# Patient Record
Sex: Female | Born: 1950 | Race: White | Hispanic: No | Marital: Married | Smoking: Never smoker
Health system: Southern US, Community
[De-identification: ages and names within clinical notes are randomized; demographics above are authoritative.]

## PROBLEM LIST (undated history)

## (undated) DIAGNOSIS — T7840XA Allergy, unspecified, initial encounter: Secondary | ICD-10-CM

## (undated) HISTORY — PX: BREAST BIOPSY: SHX20

## (undated) HISTORY — PX: COSMETIC SURGERY: SHX468

## (undated) HISTORY — DX: Allergy, unspecified, initial encounter: T78.40XA

---

## 1999-03-19 ENCOUNTER — Encounter: Payer: Self-pay | Admitting: Internal Medicine

## 1999-03-19 ENCOUNTER — Encounter: Admission: RE | Admit: 1999-03-19 | Discharge: 1999-03-19 | Payer: Self-pay | Admitting: Internal Medicine

## 1999-03-26 ENCOUNTER — Other Ambulatory Visit: Admission: RE | Admit: 1999-03-26 | Discharge: 1999-03-26 | Payer: Self-pay | Admitting: *Deleted

## 1999-03-26 ENCOUNTER — Encounter (INDEPENDENT_AMBULATORY_CARE_PROVIDER_SITE_OTHER): Payer: Self-pay | Admitting: *Deleted

## 1999-05-07 ENCOUNTER — Encounter (INDEPENDENT_AMBULATORY_CARE_PROVIDER_SITE_OTHER): Payer: Self-pay

## 1999-05-07 ENCOUNTER — Ambulatory Visit (HOSPITAL_COMMUNITY): Admission: RE | Admit: 1999-05-07 | Discharge: 1999-05-07 | Payer: Self-pay | Admitting: Obstetrics & Gynecology

## 2000-07-03 ENCOUNTER — Encounter: Admission: RE | Admit: 2000-07-03 | Discharge: 2000-07-03 | Payer: Self-pay | Admitting: Internal Medicine

## 2000-07-03 ENCOUNTER — Encounter: Payer: Self-pay | Admitting: Internal Medicine

## 2000-07-31 ENCOUNTER — Encounter: Admission: RE | Admit: 2000-07-31 | Discharge: 2000-07-31 | Payer: Self-pay | Admitting: Internal Medicine

## 2000-07-31 ENCOUNTER — Encounter: Payer: Self-pay | Admitting: Internal Medicine

## 2001-10-04 ENCOUNTER — Ambulatory Visit (HOSPITAL_COMMUNITY): Admission: RE | Admit: 2001-10-04 | Discharge: 2001-10-04 | Payer: Self-pay | Admitting: Gastroenterology

## 2002-12-20 ENCOUNTER — Encounter: Admission: RE | Admit: 2002-12-20 | Discharge: 2002-12-20 | Payer: Self-pay | Admitting: Internal Medicine

## 2002-12-20 ENCOUNTER — Encounter: Payer: Self-pay | Admitting: Internal Medicine

## 2002-12-20 ENCOUNTER — Other Ambulatory Visit: Admission: RE | Admit: 2002-12-20 | Discharge: 2002-12-20 | Payer: Self-pay | Admitting: Obstetrics and Gynecology

## 2003-12-23 ENCOUNTER — Encounter: Admission: RE | Admit: 2003-12-23 | Discharge: 2003-12-23 | Payer: Self-pay | Admitting: Internal Medicine

## 2004-07-22 ENCOUNTER — Encounter: Admission: RE | Admit: 2004-07-22 | Discharge: 2004-07-22 | Payer: Self-pay | Admitting: Internal Medicine

## 2005-03-04 ENCOUNTER — Encounter: Admission: RE | Admit: 2005-03-04 | Discharge: 2005-03-04 | Payer: Self-pay | Admitting: Internal Medicine

## 2005-11-21 ENCOUNTER — Other Ambulatory Visit: Admission: RE | Admit: 2005-11-21 | Discharge: 2005-11-21 | Payer: Self-pay | Admitting: Obstetrics and Gynecology

## 2006-03-09 ENCOUNTER — Encounter: Admission: RE | Admit: 2006-03-09 | Discharge: 2006-03-09 | Payer: Self-pay | Admitting: Internal Medicine

## 2007-03-26 ENCOUNTER — Encounter: Admission: RE | Admit: 2007-03-26 | Discharge: 2007-03-26 | Payer: Self-pay | Admitting: Internal Medicine

## 2007-09-14 ENCOUNTER — Encounter: Admission: RE | Admit: 2007-09-14 | Discharge: 2007-09-14 | Payer: Self-pay | Admitting: Internal Medicine

## 2008-05-09 ENCOUNTER — Encounter: Admission: RE | Admit: 2008-05-09 | Discharge: 2008-05-09 | Payer: Self-pay | Admitting: Internal Medicine

## 2008-06-20 ENCOUNTER — Other Ambulatory Visit: Admission: RE | Admit: 2008-06-20 | Discharge: 2008-06-20 | Payer: Self-pay | Admitting: Obstetrics and Gynecology

## 2009-11-02 ENCOUNTER — Other Ambulatory Visit: Admission: RE | Admit: 2009-11-02 | Discharge: 2009-11-02 | Payer: Self-pay | Admitting: Obstetrics and Gynecology

## 2009-11-23 ENCOUNTER — Encounter: Admission: RE | Admit: 2009-11-23 | Discharge: 2009-11-23 | Payer: Self-pay | Admitting: Internal Medicine

## 2010-08-13 NOTE — Op Note (Signed)
[facility]  Patient:    Teresa Decker, Teresa Decker               MRN: [id number] Proc. Date: 05/07/99 Adm. Date:  [id number] Attending:  Genia Del                           Operative Report  DATE OF BIRTH:                April 23, 1950.  PREOPERATIVE DIAGNOSIS:       Menometrorrhagia with possible endocervical polyp.  POSTOPERATIVE DIAGNOSIS:      Menometrorrhagia with possible endocervical polyp.  INTERVENTION:                 Hysteroscopy with dilatation and curettage and polypectomy.  SURGEON:                      Genia Del, M.D.  ANESTHESIA:                   Gretta Cool., M.D.  DESCRIPTION OF PROCEDURE:     Under MAC, the patient is in the lithotomy position. The patient is prepped with Betadine on the suprapubic, vulvular and vaginal areas. A bladder catheterization is done and the patient is draped as usual.  The vaginal exam revealed a retroverted uterus, slight increase in volume with no adnexal mass. The speculum is inserted. The paracervical block is done with methane 1% 20 cc re used at 4 oclock, 8 oclock and at the uterosacral ligament location and on the anterior lip of the cervix.  We applied the ___  clamp on the anterior lip of the cervix.  The patient is dilated with Hegar dilators up to #33.  The hysterometry is 9 cm.  The patient is done without any difficulty.  Then the hysteroscope is introduced.  Pictures are taken of the ostia.  The cavity is normal in appearance. There might be a very small anterior polyp or this might correspond to an endometrial ___ .  Anyway, this is removed with the loop instrument by cauterization.  Then the hysteroscope is removed, the cavity is curetted with a  sharp curette on all the surfaces systematically.  The endometrial curettings are sent to pathology.  Then the hysteroscope is introduced once more.  The cavity s normal in appearance.  Hemostasis is  adequate.  The instruments are removed. The estimated blood loss was minimal.  The ins and outs deficits were -60 cc.  No complication occurred and the patient was transferred to recovery room in good status. DD:  05/07/99 TD:  05/09/99 Job: [id number]

## 2010-08-13 NOTE — Op Note (Signed)
[place]  Patient:    CHANTALE, LEUGERS Visit Number: [id number] MRN: [id number]          Service Type: Attending:  Verlin Grills, M.D. Dictated by:   Verlin Grills, M.D. Proc. Date: 10/04/01 Adm. Date:  10/04/01   CC:         Winn Jock. Earl Gala, M.D.   Operative Report  INDICATIONS:  Ms. Latayvia Mandujano is a 60 year old female born on 1950/04/28.  Ms. Crammer father was diagnosed with colon cancer in his mid-55s.  The patient is to undergo her first screening colonoscopy with polypectomy to prevent colon cancer.  I discussed with the patient the complications associated with colonoscopy and polypectomy including a 15 per 1000 risk of bleeding and 4 per 1000 risk of colon perforation requiring surgical repair.  The patient has signed the operative permit.  ENDOSCOPIST:  Verlin Grills, M.D.  PREMEDICATION:  Versed 7.5 mg, fentanyl 50 mcg.  ENDOSCOPE:  Olympus pediatric colonoscope.  DESCRIPTION OF PROCEDURE:  After obtaining informed consent, the patient was placed in the left lateral decubitus position.  I administered intravenous fentanyl and intravenous Versed to achieve conscious sedation for the procedure.  The patients blood pressure, oxygen saturation, and cardiac rhythm were monitored throughout the procedure and documented in the medical record.  Anal inspection was normal.  Digital rectal examination was normal.  The Olympus pediatric video colonoscope was introduced into the rectum and easily advanced to the cecum.  Colonic preparation for the examination today was excellent.  Rectum normal.  Sigmoid colon and descending colon normal.  Splenic flexure normal.  Transverse colon normal.  Hepatic flexure normal.  Ascending colon normal.  Cecum and ileocecal valve normal.  ASSESSMENT:  Normal screening proctocolonoscopy to the cecum.  RECOMMENDATIONS:  Repeat  colonoscopy in 10 years. Dictated by:   Verlin Grills, M.D. Attending:  Verlin Grills, M.D. DD:  10/04/01 TD:  10/07/01 Job: [id number]

## 2011-02-03 ENCOUNTER — Other Ambulatory Visit: Payer: Self-pay | Admitting: Internal Medicine

## 2011-02-03 DIAGNOSIS — Z1231 Encounter for screening mammogram for malignant neoplasm of breast: Secondary | ICD-10-CM

## 2011-03-14 ENCOUNTER — Ambulatory Visit: Payer: Self-pay

## 2011-03-18 ENCOUNTER — Ambulatory Visit
Admission: RE | Admit: 2011-03-18 | Discharge: 2011-03-18 | Disposition: A | Discharge status: Home / Self Care | Payer: BC Managed Care – PPO | Source: Ambulatory Visit | Attending: Internal Medicine | Admitting: Internal Medicine

## 2011-03-18 DIAGNOSIS — Z1231 Encounter for screening mammogram for malignant neoplasm of breast: Secondary | ICD-10-CM

## 2011-11-09 ENCOUNTER — Other Ambulatory Visit: Payer: Self-pay | Admitting: Obstetrics and Gynecology

## 2011-11-09 ENCOUNTER — Other Ambulatory Visit (HOSPITAL_COMMUNITY)
Admission: RE | Admit: 2011-11-09 | Discharge: 2011-11-09 | Disposition: A | Discharge status: Home / Self Care | Payer: BC Managed Care – PPO | Source: Ambulatory Visit | Attending: Obstetrics and Gynecology | Admitting: Obstetrics and Gynecology

## 2011-11-09 DIAGNOSIS — Z1151 Encounter for screening for human papillomavirus (HPV): Secondary | ICD-10-CM | POA: Insufficient documentation

## 2011-11-09 DIAGNOSIS — Z01419 Encounter for gynecological examination (general) (routine) without abnormal findings: Secondary | ICD-10-CM | POA: Insufficient documentation

## 2012-03-14 DIAGNOSIS — N3010 Interstitial cystitis (chronic) without hematuria: Secondary | ICD-10-CM | POA: Insufficient documentation

## 2012-03-14 DIAGNOSIS — R339 Retention of urine, unspecified: Secondary | ICD-10-CM | POA: Insufficient documentation

## 2012-03-14 DIAGNOSIS — R399 Unspecified symptoms and signs involving the genitourinary system: Secondary | ICD-10-CM | POA: Insufficient documentation

## 2012-03-14 DIAGNOSIS — N390 Urinary tract infection, site not specified: Secondary | ICD-10-CM | POA: Insufficient documentation

## 2012-03-30 ENCOUNTER — Other Ambulatory Visit: Payer: Self-pay | Admitting: Internal Medicine

## 2012-03-30 DIAGNOSIS — Z1231 Encounter for screening mammogram for malignant neoplasm of breast: Secondary | ICD-10-CM

## 2012-04-30 ENCOUNTER — Ambulatory Visit
Admission: RE | Admit: 2012-04-30 | Discharge: 2012-04-30 | Disposition: A | Discharge status: Home / Self Care | Payer: BC Managed Care – PPO | Source: Ambulatory Visit | Attending: Internal Medicine | Admitting: Internal Medicine

## 2012-04-30 DIAGNOSIS — Z1231 Encounter for screening mammogram for malignant neoplasm of breast: Secondary | ICD-10-CM

## 2012-12-16 ENCOUNTER — Ambulatory Visit (INDEPENDENT_AMBULATORY_CARE_PROVIDER_SITE_OTHER): Payer: BC Managed Care – PPO | Admitting: Physician Assistant

## 2012-12-16 VITALS — BP 148/88 | HR 118 | Temp 100.8°F | Resp 18 | Ht 62.0 in | Wt 136.4 lb

## 2012-12-16 DIAGNOSIS — R059 Cough, unspecified: Secondary | ICD-10-CM

## 2012-12-16 DIAGNOSIS — R05 Cough: Secondary | ICD-10-CM

## 2012-12-16 DIAGNOSIS — J9801 Acute bronchospasm: Secondary | ICD-10-CM

## 2012-12-16 DIAGNOSIS — J019 Acute sinusitis, unspecified: Secondary | ICD-10-CM

## 2012-12-16 MED ORDER — ALBUTEROL SULFATE HFA 108 (90 BASE) MCG/ACT IN AERS: 2.0000 | INHALATION_SPRAY | RESPIRATORY_TRACT | Status: AC | PRN

## 2012-12-16 MED ORDER — AMOXICILLIN-POT CLAVULANATE 875-125 MG PO TABS: 1.0000 | ORAL_TABLET | Freq: Two times a day (BID) | ORAL | Status: DC

## 2012-12-16 NOTE — Progress Notes (Signed)
Patient ID: Teresa Decker MRN: [id number], DOB: 1950/11/09, 62 y.o. Date of Encounter: 12/16/2012, 2:17 PM  Primary Physician: Darnelle Bos, MD  Chief Complaint:  Chief Complaint  Patient presents with  . Headache    started Tuesday  . Nasal Congestion  . Cough    HPI: 62 y.o. female presents with 4-5 day history of nasal congestion, post nasal drip, sore throat, sinus pressure, headache, and cough. Subjective fever and chills. Nasal congestion thick and green/yellow. Sinus pressure is the worst symptom. Cough is productive mildly secondary to post nasal drip and not associated with time of day. Headache is located along the sinuses. Feels like sinus pressure. Normal hearing. Has tried OTC cold preps with some success. No GI complaints. Appetite normal. Husband has been sick with similar illness as well. He is feeling better now.   No recent antibiotics or recent travels.  No leg trauma, sedentary periods, h/o cancer, or tobacco use.  Past Medical History  Diagnosis Date  . Allergy      Home Meds: Prior to Admission medications   Medication Sig Start Date End Date Taking? Authorizing Provider  cetirizine (ZYRTEC) 1 MG/ML syrup Take by mouth daily.   Yes Historical Provider, MD  FLUoxetine (PROZAC) 10 MG tablet Take 10 mg by mouth daily.   Yes Historical Provider, MD  fluticasone (FLONASE) 50 MCG/ACT nasal spray Place 2 sprays into the nose daily.   Yes Historical Provider, MD  imipramine (TOFRANIL) 10 MG tablet Take 30 mg by mouth at bedtime.   Yes Historical Provider, MD  Multiple Vitamin (MULTIVITAMIN) capsule Take 1 capsule by mouth daily.   Yes Historical Provider, MD                  Allergies: No Known Allergies  History   Social History  . Marital Status: Married    Spouse Name: N/A    Number of Children: N/A  . Years of Education: N/A   Occupational History  . Not on file.   Social History Main Topics  . Smoking status: Never Smoker     . Smokeless tobacco: Not on file  . Alcohol Use: No  . Drug Use: No  . Sexual Activity: Not on file   Other Topics Concern  . Not on file   Social History Narrative  . No narrative on file     Review of Systems: Constitutional: Positive for fever, chills, and fatigue  HEENT: see above Cardiovascular: negative for chest pain or palpitations Respiratory: Positive for cough, wheezing, and SOB Abdominal: negative for abdominal pain, nausea, vomiting or diarrhea Dermatological: negative for rash Neurologic: Positive for headache. Negative for dizziness or vertigo   Physical Exam: Blood pressure 148/88, pulse 118, temperature 100.8 F (38.2 C), temperature source Oral, resp. rate 18, height 5\' 2"  (1.575 m), weight 136 lb 6.4 oz (61.871 kg), SpO2 98.00%., Body mass index is 24.94 kg/(m^2). General: Well developed, well nourished, in no acute distress. Head: Normocephalic, atraumatic, eyes without discharge, sclera non-icteric, nares are congested. Bilateral auditory canals clear, TM's are without perforation, pearly grey with reflective cone of light bilaterally. Serous effusion bilaterally behind TM's. Palpation of the sinuses relieves pressure. Oral cavity moist, dentition normal. Posterior pharynx with post nasal drip and mild erythema. No peritonsillar abscess or tonsillar exudate. Uvula midline.  Neck: Supple. No thyromegaly. Full ROM. No lymphadenopathy. Lungs: Clear bilaterally to auscultation without wheezes, rales, or rhonchi. Breathing is unlabored.  Heart: RRR with S1 S2. No murmurs, rubs,  or gallops appreciated. Msk:  Strength and tone normal for age. Extremities: No clubbing or cyanosis. No edema. Neuro: Alert and oriented X 3. Moves all extremities spontaneously. CNII-XII grossly in tact. Psych:  Responds to questions appropriately with a normal affect.     ASSESSMENT AND PLAN:  62 y.o. female with sinusitis, cough, and RAD -Augmentin 875/125 mg 1 po bid #20 no  RF -Proventil 2 puffs inhaled q 4-6 hours prn #1 no RF -Will take OTC Robitussin -Mucinex -Tylenol/Motrin prn -Rest/fluids -RTC precautions -RTC 3-5 days if no improvement  Signed, Eula Listen, PA-C 12/16/2012 2:17 PM

## 2013-06-11 ENCOUNTER — Other Ambulatory Visit: Payer: Self-pay

## 2013-06-11 DIAGNOSIS — Z1231 Encounter for screening mammogram for malignant neoplasm of breast: Secondary | ICD-10-CM

## 2013-07-12 ENCOUNTER — Ambulatory Visit
Admission: RE | Admit: 2013-07-12 | Discharge: 2013-07-12 | Disposition: A | Discharge status: Home / Self Care | Payer: BC Managed Care – PPO | Source: Ambulatory Visit

## 2013-07-12 DIAGNOSIS — Z1231 Encounter for screening mammogram for malignant neoplasm of breast: Secondary | ICD-10-CM

## 2013-07-17 ENCOUNTER — Other Ambulatory Visit: Payer: Self-pay | Admitting: Internal Medicine

## 2013-07-17 DIAGNOSIS — R928 Other abnormal and inconclusive findings on diagnostic imaging of breast: Secondary | ICD-10-CM

## 2013-07-19 ENCOUNTER — Ambulatory Visit
Admission: RE | Admit: 2013-07-19 | Discharge: 2013-07-19 | Disposition: A | Discharge status: Home / Self Care | Payer: BC Managed Care – PPO | Source: Ambulatory Visit | Attending: Internal Medicine | Admitting: Internal Medicine

## 2013-07-19 DIAGNOSIS — R928 Other abnormal and inconclusive findings on diagnostic imaging of breast: Secondary | ICD-10-CM

## 2014-09-26 ENCOUNTER — Telehealth: Payer: Self-pay | Admitting: Internal Medicine

## 2014-09-26 NOTE — Telephone Encounter (Signed)
Called pt and she was concerned that Dr Elvera LennoxGherghe didn't have a copy of her most recent Bone density test. Pt to get her physicians office to send a copy of the report to Dr Elvera LennoxGherghe prior to her appt in Aug.

## 2014-09-26 NOTE — Telephone Encounter (Signed)
Please call pt back she wants tomake sure we have enough information for her first apt call pt at [phone] or [phone]

## 2014-10-01 ENCOUNTER — Ambulatory Visit: Payer: BC Managed Care – PPO | Admitting: Internal Medicine

## 2014-10-27 ENCOUNTER — Other Ambulatory Visit: Payer: Self-pay

## 2014-10-27 DIAGNOSIS — Z1231 Encounter for screening mammogram for malignant neoplasm of breast: Secondary | ICD-10-CM

## 2014-11-12 ENCOUNTER — Ambulatory Visit: Payer: BC Managed Care – PPO | Admitting: Internal Medicine

## 2014-11-18 ENCOUNTER — Other Ambulatory Visit: Payer: Self-pay | Admitting: Obstetrics and Gynecology

## 2014-11-18 ENCOUNTER — Other Ambulatory Visit (HOSPITAL_COMMUNITY)
Admission: RE | Admit: 2014-11-18 | Discharge: 2014-11-18 | Disposition: A | Discharge status: Home / Self Care | Payer: BC Managed Care – PPO | Source: Ambulatory Visit | Attending: Obstetrics and Gynecology | Admitting: Obstetrics and Gynecology

## 2014-11-18 DIAGNOSIS — Z1151 Encounter for screening for human papillomavirus (HPV): Secondary | ICD-10-CM | POA: Insufficient documentation

## 2014-11-18 DIAGNOSIS — Z01419 Encounter for gynecological examination (general) (routine) without abnormal findings: Secondary | ICD-10-CM | POA: Diagnosis not present

## 2014-11-19 LAB — CYTOLOGY - PAP

## 2014-12-03 ENCOUNTER — Ambulatory Visit
Admission: RE | Admit: 2014-12-03 | Discharge: 2014-12-03 | Disposition: A | Discharge status: Home / Self Care | Payer: BC Managed Care – PPO | Source: Ambulatory Visit

## 2014-12-03 DIAGNOSIS — Z1231 Encounter for screening mammogram for malignant neoplasm of breast: Secondary | ICD-10-CM

## 2015-12-24 ENCOUNTER — Other Ambulatory Visit: Payer: Self-pay | Admitting: Geriatric Medicine

## 2015-12-24 DIAGNOSIS — Z1231 Encounter for screening mammogram for malignant neoplasm of breast: Secondary | ICD-10-CM

## 2016-01-11 ENCOUNTER — Ambulatory Visit
Admission: RE | Admit: 2016-01-11 | Discharge: 2016-01-11 | Disposition: A | Discharge status: Home / Self Care | Payer: BC Managed Care – PPO | Source: Ambulatory Visit | Attending: Geriatric Medicine | Admitting: Geriatric Medicine

## 2016-01-11 DIAGNOSIS — Z1231 Encounter for screening mammogram for malignant neoplasm of breast: Secondary | ICD-10-CM

## 2017-03-30 ENCOUNTER — Other Ambulatory Visit: Payer: Self-pay | Admitting: Geriatric Medicine

## 2017-03-30 DIAGNOSIS — Z1231 Encounter for screening mammogram for malignant neoplasm of breast: Secondary | ICD-10-CM

## 2017-04-20 ENCOUNTER — Ambulatory Visit
Admission: RE | Admit: 2017-04-20 | Discharge: 2017-04-20 | Disposition: A | Discharge status: Home / Self Care | Payer: BC Managed Care – PPO | Source: Ambulatory Visit | Attending: Geriatric Medicine | Admitting: Geriatric Medicine

## 2017-04-20 DIAGNOSIS — Z1231 Encounter for screening mammogram for malignant neoplasm of breast: Secondary | ICD-10-CM

## 2017-11-24 ENCOUNTER — Other Ambulatory Visit: Payer: Self-pay | Admitting: Obstetrics and Gynecology

## 2017-11-24 ENCOUNTER — Other Ambulatory Visit (HOSPITAL_COMMUNITY)
Admission: RE | Admit: 2017-11-24 | Discharge: 2017-11-24 | Disposition: A | Discharge status: Home / Self Care | Payer: BC Managed Care – PPO | Source: Ambulatory Visit | Attending: Obstetrics and Gynecology | Admitting: Obstetrics and Gynecology

## 2017-11-24 DIAGNOSIS — Z01411 Encounter for gynecological examination (general) (routine) with abnormal findings: Secondary | ICD-10-CM | POA: Insufficient documentation

## 2017-11-29 LAB — CYTOLOGY - PAP
DIAGNOSIS: NEGATIVE
HPV: NOT DETECTED

## 2018-06-29 ENCOUNTER — Other Ambulatory Visit: Payer: Self-pay | Admitting: Obstetrics and Gynecology

## 2018-06-29 DIAGNOSIS — Z1231 Encounter for screening mammogram for malignant neoplasm of breast: Secondary | ICD-10-CM

## 2018-08-24 ENCOUNTER — Ambulatory Visit
Admission: RE | Admit: 2018-08-24 | Discharge: 2018-08-24 | Disposition: A | Discharge status: Home / Self Care | Payer: Medicare Other | Source: Ambulatory Visit | Attending: Obstetrics and Gynecology | Admitting: Obstetrics and Gynecology

## 2018-08-24 ENCOUNTER — Other Ambulatory Visit: Payer: Self-pay

## 2018-08-24 DIAGNOSIS — Z1231 Encounter for screening mammogram for malignant neoplasm of breast: Secondary | ICD-10-CM

## 2019-08-23 ENCOUNTER — Other Ambulatory Visit: Payer: Self-pay | Admitting: Obstetrics and Gynecology

## 2019-08-23 DIAGNOSIS — Z1231 Encounter for screening mammogram for malignant neoplasm of breast: Secondary | ICD-10-CM

## 2019-08-27 ENCOUNTER — Ambulatory Visit
Admission: RE | Admit: 2019-08-27 | Discharge: 2019-08-27 | Disposition: A | Discharge status: Home / Self Care | Payer: Medicare PPO | Source: Ambulatory Visit

## 2019-08-27 ENCOUNTER — Other Ambulatory Visit: Payer: Self-pay

## 2019-08-27 DIAGNOSIS — Z1231 Encounter for screening mammogram for malignant neoplasm of breast: Secondary | ICD-10-CM | POA: Diagnosis not present

## 2019-10-21 DIAGNOSIS — N3281 Overactive bladder: Secondary | ICD-10-CM | POA: Diagnosis not present

## 2019-10-21 DIAGNOSIS — N393 Stress incontinence (female) (male): Secondary | ICD-10-CM | POA: Diagnosis not present

## 2020-01-02 DIAGNOSIS — M810 Age-related osteoporosis without current pathological fracture: Secondary | ICD-10-CM | POA: Diagnosis not present

## 2020-01-02 DIAGNOSIS — Z23 Encounter for immunization: Secondary | ICD-10-CM | POA: Diagnosis not present

## 2020-01-12 ENCOUNTER — Ambulatory Visit
Admission: EM | Admit: 2020-01-12 | Discharge: 2020-01-12 | Disposition: A | Discharge status: Home / Self Care | Payer: Medicare PPO

## 2020-01-12 ENCOUNTER — Other Ambulatory Visit: Payer: Self-pay

## 2020-01-12 DIAGNOSIS — L03114 Cellulitis of left upper limb: Secondary | ICD-10-CM

## 2020-01-12 MED ORDER — AMOXICILLIN-POT CLAVULANATE 875-125 MG PO TABS: 1.0000 | ORAL_TABLET | Freq: Two times a day (BID) | ORAL | 0 refills | Status: AC

## 2020-01-12 NOTE — ED Triage Notes (Signed)
Pt states her cat bit her yesterday on her left wrist. PT has multiple puncture marks but is not bleeding currently. Pt is aox4 and ambulatory.

## 2020-01-12 NOTE — ED Provider Notes (Signed)
[facility]    CSN: [id number] Arrival date & time: 01/12/20  1034      History   Chief Complaint Chief Complaint  Patient presents with   Animal Bite    occurred yesterday    HPI Teresa Decker is a 69 y.o. female.   69 year old female comes in for wound check after cat bite yesterday.  Puncture wound to the left wrist with swelling, erythema, warmth.  States slightly improved from yesterday.  Denies fever.  Thinks tetanus up to date, will check with PCP.      Past Medical History:  Diagnosis Date   Allergy     There are no problems to display for this patient.   Past Surgical History:  Procedure Laterality Date   COSMETIC SURGERY      OB History   No obstetric history on file.      Home Medications    Prior to Admission medications   Medication Sig Start Date End Date Taking? Authorizing Provider  famotidine (PEPCID) 20 MG tablet Take 20 mg by mouth daily.   Yes [provider]  albuterol (PROVENTIL HFA;VENTOLIN HFA) 108 (90 BASE) MCG/ACT inhaler Inhale 2 puffs into the lungs every 4 (four) hours as needed for wheezing. 12/16/12   Sondra Barges, PA-C  amoxicillin-clavulanate (AUGMENTIN) 875-125 MG tablet Take 1 tablet by mouth every 12 (twelve) hours. 01/12/20   Cathie Hoops, Walfred Bettendorf V, PA-C  cetirizine (ZYRTEC) 1 MG/ML syrup Take by mouth daily.    [provider]  FLUoxetine (PROZAC) 10 MG tablet Take 10 mg by mouth daily.    [provider]  fluticasone (FLONASE) 50 MCG/ACT nasal spray Place 2 sprays into the nose daily.    [provider]  imipramine (TOFRANIL) 10 MG tablet Take 40 mg by mouth at bedtime.     [provider]  Multiple Vitamin (MULTIVITAMIN) capsule Take 1 capsule by mouth daily.    [provider]    Family History Family History  Problem Relation Age of Onset   Breast cancer Cousin 59    Social History Social History   Tobacco Use   Smoking status: Never Smoker    Smokeless tobacco: Never Used  Building services engineer Use: Never used  Substance Use Topics   Alcohol use: No   Drug use: No     Allergies   Patient has no known allergies.   Review of Systems Review of Systems  Reason unable to perform ROS: See HPI as above.     Physical Exam Triage Vital Signs ED Triage Vitals  Enc Vitals Group     BP 01/12/20 1155 (!) 146/84     Pulse Rate 01/12/20 1155 84     Resp 01/12/20 1155 17     Temp 01/12/20 1155 98.5 F (36.9 C)     Temp Source 01/12/20 1155 Oral     SpO2 01/12/20 1155 98 %     Weight --      Height --      Head Circumference --      Peak Flow --      Pain Score 01/12/20 1156 0     Pain Loc --      Pain Edu? --      Excl. in GC? --    No data found.  Updated Vital Signs BP (!) 146/84 (BP Location: Left Arm)    Pulse 84    Temp 98.5 F (36.9 C) (Oral)    Resp  17    SpO2 98%   Physical Exam Constitutional:      General: She is not in acute distress.    Appearance: Normal appearance. She is well-developed. She is not toxic-appearing or diaphoretic.  HENT:     Head: Normocephalic and atraumatic.  Eyes:     Conjunctiva/sclera: Conjunctivae normal.     Pupils: Pupils are equal, round, and reactive to light.  Pulmonary:     Effort: Pulmonary effort is normal. No respiratory distress.  Musculoskeletal:     Cervical back: Normal range of motion and neck supple.     Comments: See picture below. 2 puncture wounds to the left hand/wrist with surrounding cellulitis to mid forearm. Extending approx 12 cm proximal.  No obvious streaking.  Full ROM of joints. NVI  Skin:    General: Skin is warm and dry.  Neurological:     Mental Status: She is alert and oriented to person, place, and time.          UC Treatments / Results  Labs (all labs ordered are listed, but only abnormal results are displayed) Labs Reviewed - No data to display  EKG   Radiology No results found.  Procedures Procedures (including  critical care time)  Medications Ordered in UC Medications - No data to display  Initial Impression / Assessment and Plan / UC Course  I have reviewed the triage vital signs and the nursing notes.  Pertinent labs & imaging results that were available during my care of the patient were reviewed by me and considered in my medical decision making (see chart for details).    Start Augmentin as directed.  Wound care instructions given.  Return precautions given.  Patient expresses understanding and agrees to plan.  Final Clinical Impressions(s) / UC Diagnoses   Final diagnoses:  Cellulitis of left upper extremity    ED Prescriptions    Medication Sig Dispense Auth. Provider   amoxicillin-clavulanate (AUGMENTIN) 875-125 MG tablet Take 1 tablet by mouth every 12 (twelve) hours. 14 tablet Belinda Fisher, PA-C     PDMP not reviewed this encounter.   Belinda Fisher, PA-C 01/12/20 1253

## 2020-01-12 NOTE — Discharge Instructions (Signed)
Start Augmentin as directed. Keep wound clean and dry. You can clean gently with soap and water. Warm compresses daily. Monitor for spreading redness, increased warmth, increased swelling, fever.

## 2020-06-02 DIAGNOSIS — H1789 Other corneal scars and opacities: Secondary | ICD-10-CM | POA: Diagnosis not present

## 2020-06-02 DIAGNOSIS — H2513 Age-related nuclear cataract, bilateral: Secondary | ICD-10-CM | POA: Diagnosis not present

## 2020-06-02 DIAGNOSIS — H35373 Puckering of macula, bilateral: Secondary | ICD-10-CM | POA: Diagnosis not present

## 2020-06-02 DIAGNOSIS — H52203 Unspecified astigmatism, bilateral: Secondary | ICD-10-CM | POA: Diagnosis not present

## 2020-06-19 DIAGNOSIS — K219 Gastro-esophageal reflux disease without esophagitis: Secondary | ICD-10-CM | POA: Diagnosis not present

## 2020-06-19 DIAGNOSIS — D473 Essential (hemorrhagic) thrombocythemia: Secondary | ICD-10-CM | POA: Diagnosis not present

## 2020-06-19 DIAGNOSIS — Z1389 Encounter for screening for other disorder: Secondary | ICD-10-CM | POA: Diagnosis not present

## 2020-06-19 DIAGNOSIS — F325 Major depressive disorder, single episode, in full remission: Secondary | ICD-10-CM | POA: Diagnosis not present

## 2020-06-19 DIAGNOSIS — M810 Age-related osteoporosis without current pathological fracture: Secondary | ICD-10-CM | POA: Diagnosis not present

## 2020-06-19 DIAGNOSIS — Z0000 Encounter for general adult medical examination without abnormal findings: Secondary | ICD-10-CM | POA: Diagnosis not present

## 2020-06-19 DIAGNOSIS — R030 Elevated blood-pressure reading, without diagnosis of hypertension: Secondary | ICD-10-CM | POA: Diagnosis not present

## 2020-06-19 DIAGNOSIS — Z79899 Other long term (current) drug therapy: Secondary | ICD-10-CM | POA: Diagnosis not present

## 2020-06-19 DIAGNOSIS — J45909 Unspecified asthma, uncomplicated: Secondary | ICD-10-CM | POA: Diagnosis not present

## 2020-06-19 DIAGNOSIS — E7800 Pure hypercholesterolemia, unspecified: Secondary | ICD-10-CM | POA: Diagnosis not present

## 2020-07-16 DIAGNOSIS — L821 Other seborrheic keratosis: Secondary | ICD-10-CM | POA: Diagnosis not present

## 2020-07-16 DIAGNOSIS — D1801 Hemangioma of skin and subcutaneous tissue: Secondary | ICD-10-CM | POA: Diagnosis not present

## 2020-07-16 DIAGNOSIS — D225 Melanocytic nevi of trunk: Secondary | ICD-10-CM | POA: Diagnosis not present

## 2020-07-16 DIAGNOSIS — L814 Other melanin hyperpigmentation: Secondary | ICD-10-CM | POA: Diagnosis not present

## 2020-07-22 DIAGNOSIS — M810 Age-related osteoporosis without current pathological fracture: Secondary | ICD-10-CM | POA: Diagnosis not present

## 2020-08-03 DIAGNOSIS — H1789 Other corneal scars and opacities: Secondary | ICD-10-CM | POA: Diagnosis not present

## 2020-08-20 ENCOUNTER — Other Ambulatory Visit: Payer: Self-pay | Admitting: Geriatric Medicine

## 2020-08-20 DIAGNOSIS — Z1231 Encounter for screening mammogram for malignant neoplasm of breast: Secondary | ICD-10-CM

## 2020-09-17 DIAGNOSIS — R030 Elevated blood-pressure reading, without diagnosis of hypertension: Secondary | ICD-10-CM | POA: Diagnosis not present

## 2020-10-07 DIAGNOSIS — H2513 Age-related nuclear cataract, bilateral: Secondary | ICD-10-CM | POA: Diagnosis not present

## 2020-10-23 ENCOUNTER — Other Ambulatory Visit: Payer: Self-pay

## 2020-10-23 ENCOUNTER — Ambulatory Visit
Admission: RE | Admit: 2020-10-23 | Discharge: 2020-10-23 | Disposition: A | Discharge status: Home / Self Care | Payer: Medicare PPO | Source: Ambulatory Visit

## 2020-10-23 DIAGNOSIS — N393 Stress incontinence (female) (male): Secondary | ICD-10-CM | POA: Diagnosis not present

## 2020-10-23 DIAGNOSIS — Z1231 Encounter for screening mammogram for malignant neoplasm of breast: Secondary | ICD-10-CM | POA: Diagnosis not present

## 2020-10-23 DIAGNOSIS — N3010 Interstitial cystitis (chronic) without hematuria: Secondary | ICD-10-CM | POA: Diagnosis not present

## 2020-10-23 DIAGNOSIS — N3281 Overactive bladder: Secondary | ICD-10-CM | POA: Diagnosis not present

## 2020-10-29 DIAGNOSIS — H2512 Age-related nuclear cataract, left eye: Secondary | ICD-10-CM | POA: Diagnosis not present

## 2020-10-29 DIAGNOSIS — H25812 Combined forms of age-related cataract, left eye: Secondary | ICD-10-CM | POA: Diagnosis not present

## 2020-11-12 DIAGNOSIS — H2511 Age-related nuclear cataract, right eye: Secondary | ICD-10-CM | POA: Diagnosis not present

## 2020-11-12 DIAGNOSIS — H25811 Combined forms of age-related cataract, right eye: Secondary | ICD-10-CM | POA: Diagnosis not present

## 2020-11-27 ENCOUNTER — Other Ambulatory Visit: Payer: Self-pay

## 2020-11-27 ENCOUNTER — Ambulatory Visit
Admission: EM | Admit: 2020-11-27 | Discharge: 2020-11-27 | Discharge status: Against Medical Advice | Payer: Medicare PPO

## 2020-11-27 DIAGNOSIS — S51852A Open bite of left forearm, initial encounter: Secondary | ICD-10-CM | POA: Diagnosis not present

## 2020-11-27 DIAGNOSIS — W5501XA Bitten by cat, initial encounter: Secondary | ICD-10-CM | POA: Diagnosis not present

## 2020-12-18 DIAGNOSIS — H35363 Drusen (degenerative) of macula, bilateral: Secondary | ICD-10-CM | POA: Diagnosis not present

## 2020-12-18 DIAGNOSIS — B86 Scabies: Secondary | ICD-10-CM | POA: Diagnosis not present

## 2021-01-08 DIAGNOSIS — H35353 Cystoid macular degeneration, bilateral: Secondary | ICD-10-CM | POA: Diagnosis not present

## 2021-01-26 DIAGNOSIS — Z79899 Other long term (current) drug therapy: Secondary | ICD-10-CM | POA: Diagnosis not present

## 2021-01-26 DIAGNOSIS — Z01419 Encounter for gynecological examination (general) (routine) without abnormal findings: Secondary | ICD-10-CM | POA: Diagnosis not present

## 2021-01-26 DIAGNOSIS — Z23 Encounter for immunization: Secondary | ICD-10-CM | POA: Diagnosis not present

## 2021-01-29 DIAGNOSIS — H35353 Cystoid macular degeneration, bilateral: Secondary | ICD-10-CM | POA: Diagnosis not present

## 2021-02-15 DIAGNOSIS — H35353 Cystoid macular degeneration, bilateral: Secondary | ICD-10-CM | POA: Diagnosis not present

## 2021-03-19 DIAGNOSIS — H35353 Cystoid macular degeneration, bilateral: Secondary | ICD-10-CM | POA: Diagnosis not present

## 2021-04-09 DIAGNOSIS — H35351 Cystoid macular degeneration, right eye: Secondary | ICD-10-CM | POA: Diagnosis not present

## 2021-04-23 DIAGNOSIS — H35353 Cystoid macular degeneration, bilateral: Secondary | ICD-10-CM | POA: Diagnosis not present

## 2021-05-25 DIAGNOSIS — H35353 Cystoid macular degeneration, bilateral: Secondary | ICD-10-CM | POA: Diagnosis not present

## 2021-06-08 DIAGNOSIS — H35351 Cystoid macular degeneration, right eye: Secondary | ICD-10-CM | POA: Diagnosis not present

## 2021-06-23 DIAGNOSIS — J45909 Unspecified asthma, uncomplicated: Secondary | ICD-10-CM | POA: Diagnosis not present

## 2021-06-23 DIAGNOSIS — K219 Gastro-esophageal reflux disease without esophagitis: Secondary | ICD-10-CM | POA: Diagnosis not present

## 2021-06-23 DIAGNOSIS — D473 Essential (hemorrhagic) thrombocythemia: Secondary | ICD-10-CM | POA: Diagnosis not present

## 2021-06-23 DIAGNOSIS — Z0000 Encounter for general adult medical examination without abnormal findings: Secondary | ICD-10-CM | POA: Diagnosis not present

## 2021-06-23 DIAGNOSIS — Z79899 Other long term (current) drug therapy: Secondary | ICD-10-CM | POA: Diagnosis not present

## 2021-06-23 DIAGNOSIS — E7800 Pure hypercholesterolemia, unspecified: Secondary | ICD-10-CM | POA: Diagnosis not present

## 2021-07-19 DIAGNOSIS — D2371 Other benign neoplasm of skin of right lower limb, including hip: Secondary | ICD-10-CM | POA: Diagnosis not present

## 2021-07-19 DIAGNOSIS — L821 Other seborrheic keratosis: Secondary | ICD-10-CM | POA: Diagnosis not present

## 2021-07-19 DIAGNOSIS — L570 Actinic keratosis: Secondary | ICD-10-CM | POA: Diagnosis not present

## 2021-07-19 DIAGNOSIS — L814 Other melanin hyperpigmentation: Secondary | ICD-10-CM | POA: Diagnosis not present

## 2021-07-19 DIAGNOSIS — D485 Neoplasm of uncertain behavior of skin: Secondary | ICD-10-CM | POA: Diagnosis not present

## 2021-08-18 DIAGNOSIS — M810 Age-related osteoporosis without current pathological fracture: Secondary | ICD-10-CM | POA: Diagnosis not present

## 2021-09-23 ENCOUNTER — Other Ambulatory Visit: Payer: Self-pay | Admitting: Geriatric Medicine

## 2021-09-23 ENCOUNTER — Other Ambulatory Visit: Payer: Self-pay | Admitting: Internal Medicine

## 2021-09-23 DIAGNOSIS — Z1231 Encounter for screening mammogram for malignant neoplasm of breast: Secondary | ICD-10-CM

## 2021-10-18 DIAGNOSIS — L738 Other specified follicular disorders: Secondary | ICD-10-CM | POA: Diagnosis not present

## 2021-10-18 DIAGNOSIS — D485 Neoplasm of uncertain behavior of skin: Secondary | ICD-10-CM | POA: Diagnosis not present

## 2021-10-27 ENCOUNTER — Ambulatory Visit
Admission: RE | Admit: 2021-10-27 | Discharge: 2021-10-27 | Disposition: A | Discharge status: Home / Self Care | Payer: Medicare PPO | Source: Ambulatory Visit | Attending: Internal Medicine | Admitting: Internal Medicine

## 2021-10-27 DIAGNOSIS — Z1231 Encounter for screening mammogram for malignant neoplasm of breast: Secondary | ICD-10-CM | POA: Diagnosis not present

## 2021-10-27 DIAGNOSIS — N3010 Interstitial cystitis (chronic) without hematuria: Secondary | ICD-10-CM | POA: Diagnosis not present

## 2021-10-27 DIAGNOSIS — N393 Stress incontinence (female) (male): Secondary | ICD-10-CM | POA: Diagnosis not present

## 2021-10-27 DIAGNOSIS — N3281 Overactive bladder: Secondary | ICD-10-CM | POA: Diagnosis not present

## 2021-12-24 DIAGNOSIS — Z936 Other artificial openings of urinary tract status: Secondary | ICD-10-CM | POA: Diagnosis not present

## 2022-01-12 DIAGNOSIS — Z961 Presence of intraocular lens: Secondary | ICD-10-CM | POA: Diagnosis not present

## 2022-02-21 DIAGNOSIS — M810 Age-related osteoporosis without current pathological fracture: Secondary | ICD-10-CM | POA: Diagnosis not present

## 2022-02-21 DIAGNOSIS — Z79899 Other long term (current) drug therapy: Secondary | ICD-10-CM | POA: Diagnosis not present

## 2022-02-22 ENCOUNTER — Other Ambulatory Visit: Payer: Self-pay | Admitting: Internal Medicine

## 2022-02-22 DIAGNOSIS — M810 Age-related osteoporosis without current pathological fracture: Secondary | ICD-10-CM

## 2022-06-03 IMAGING — MG MM DIGITAL SCREENING BILAT W/ TOMO AND CAD
8 series · 9 of 24 positions shown · non-contrast
Comparison: Previous exam(s).

CLINICAL DATA: Screening.

EXAM:
DIGITAL SCREENING BILATERAL MAMMOGRAM WITH TOMOSYNTHESIS AND CAD
TECHNIQUE: Bilateral screening digital craniocaudal and mediolateral oblique
mammograms were obtained. Bilateral screening digital breast
tomosynthesis was performed. The images were evaluated with
computer-aided detection.

[R CC synth-2D]
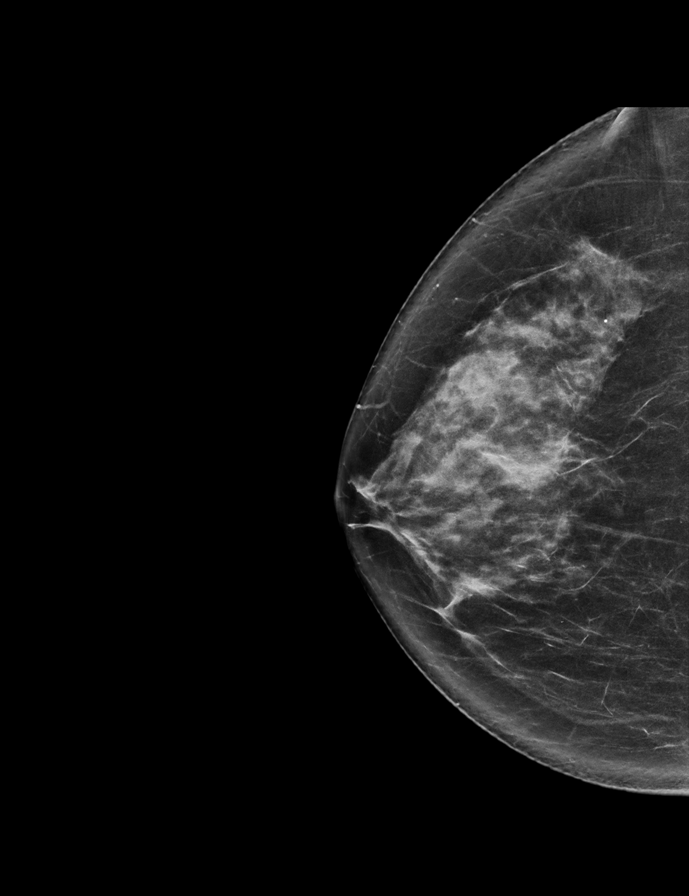

[R MLO synth-2D]
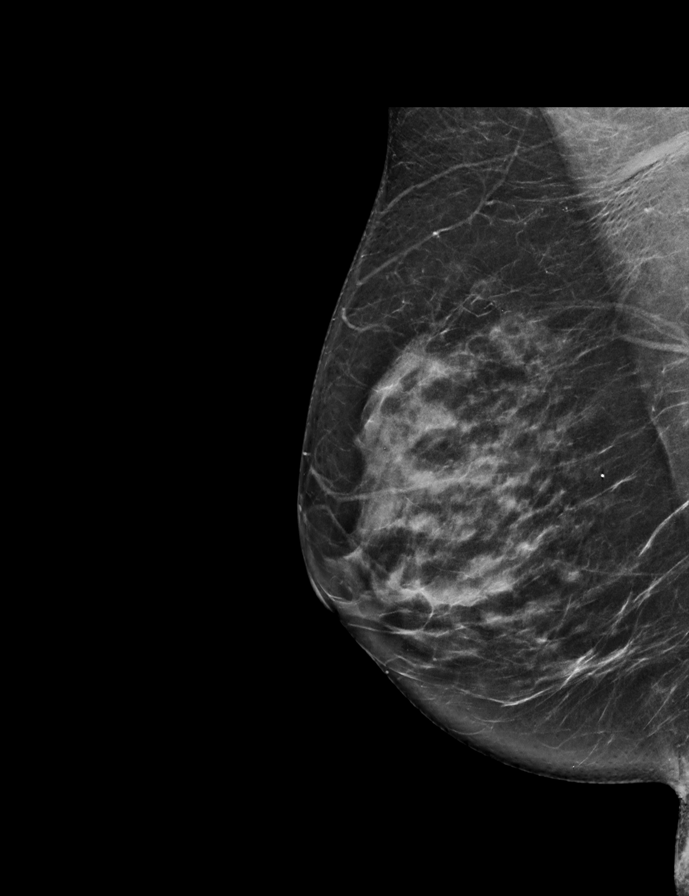

[L MLO synth-2D]
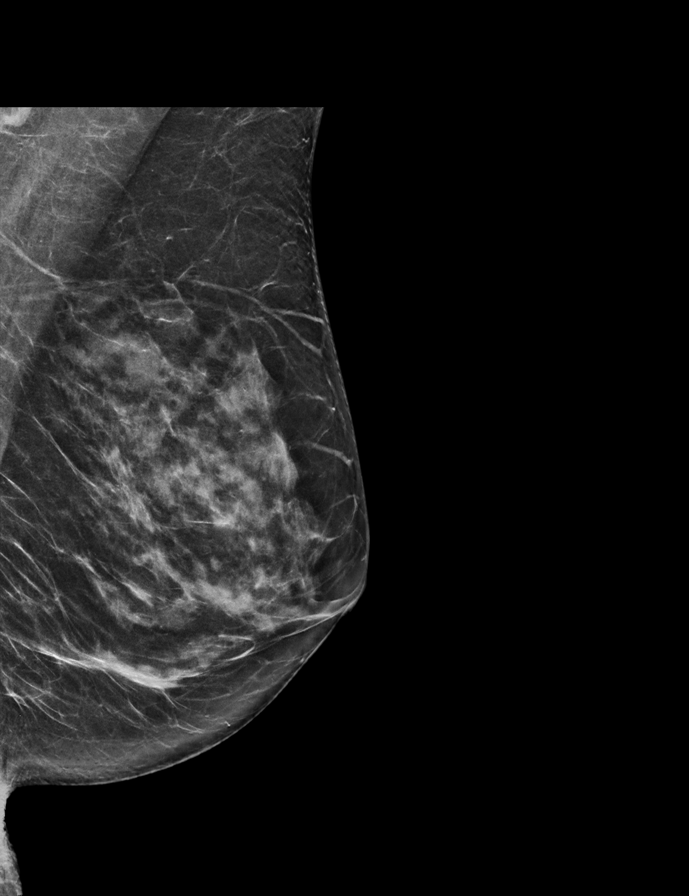

[L CC synth-2D]
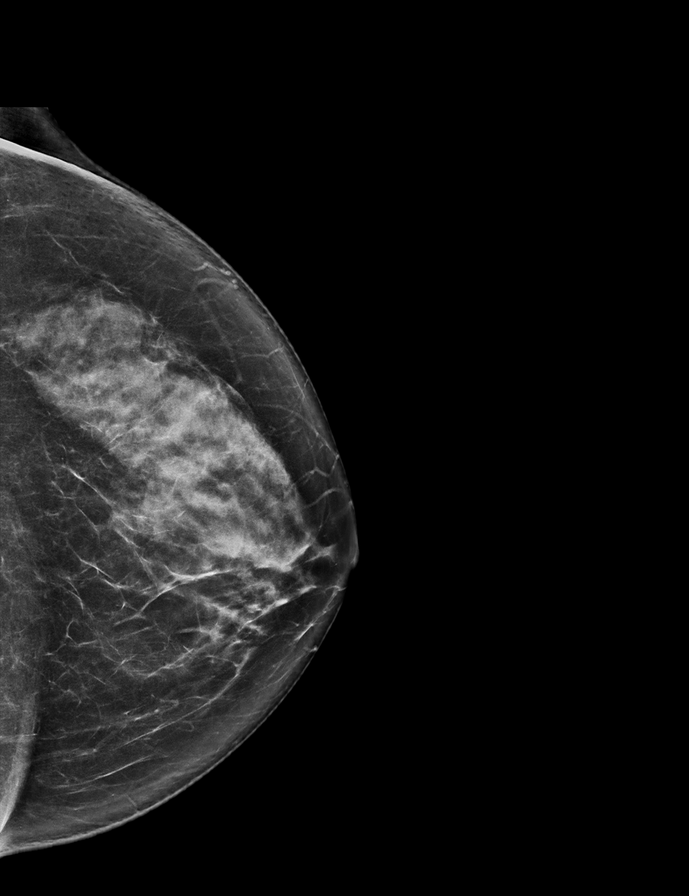

[R CC tomo · 2 of 69 frames shown]
[frame 23/69]
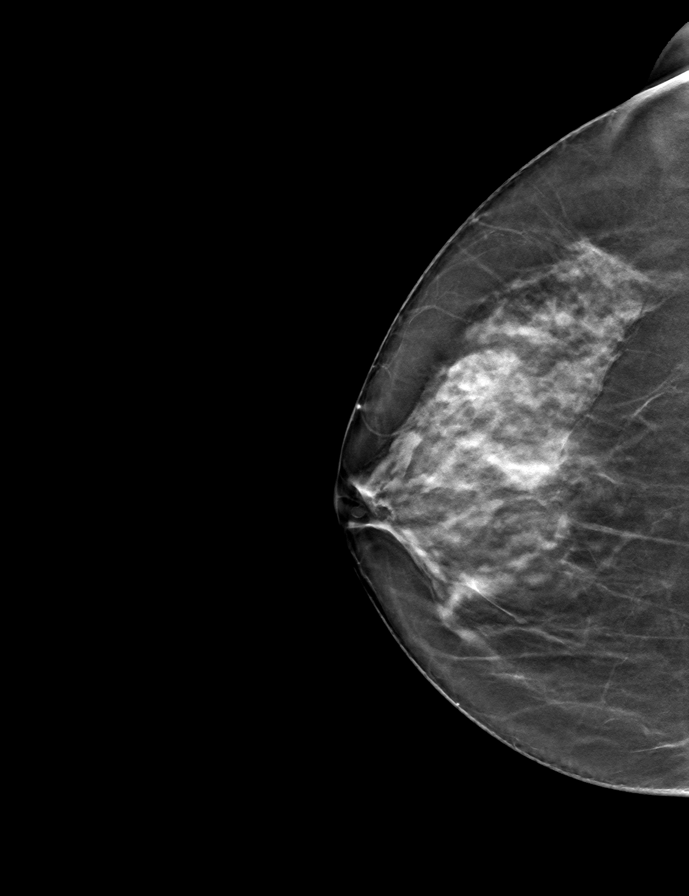
[frame 35/69]
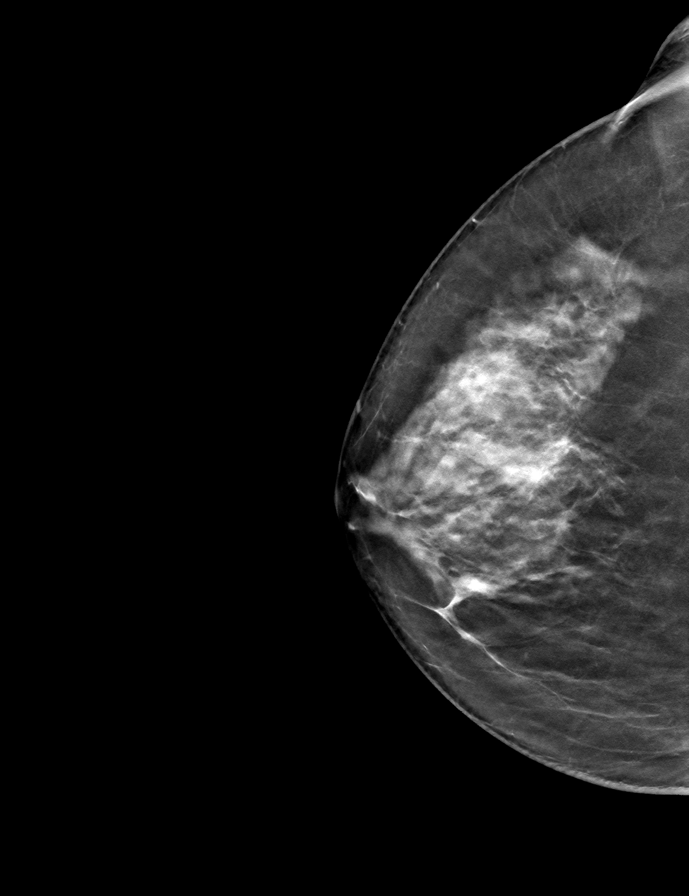

[R MLO tomo · tomo slice 35/69.0]
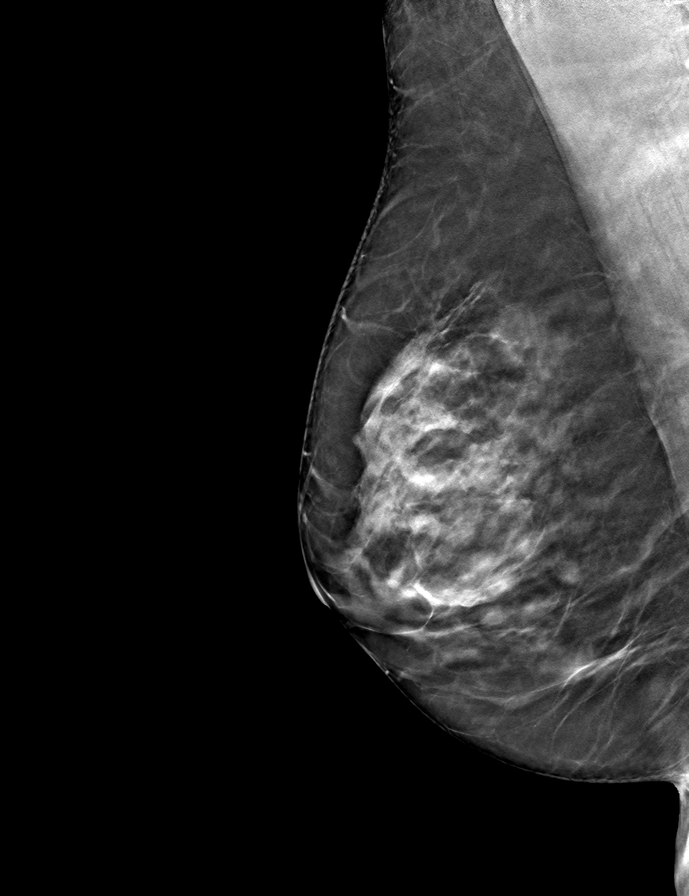

[L MLO tomo · tomo slice 35/70.0]
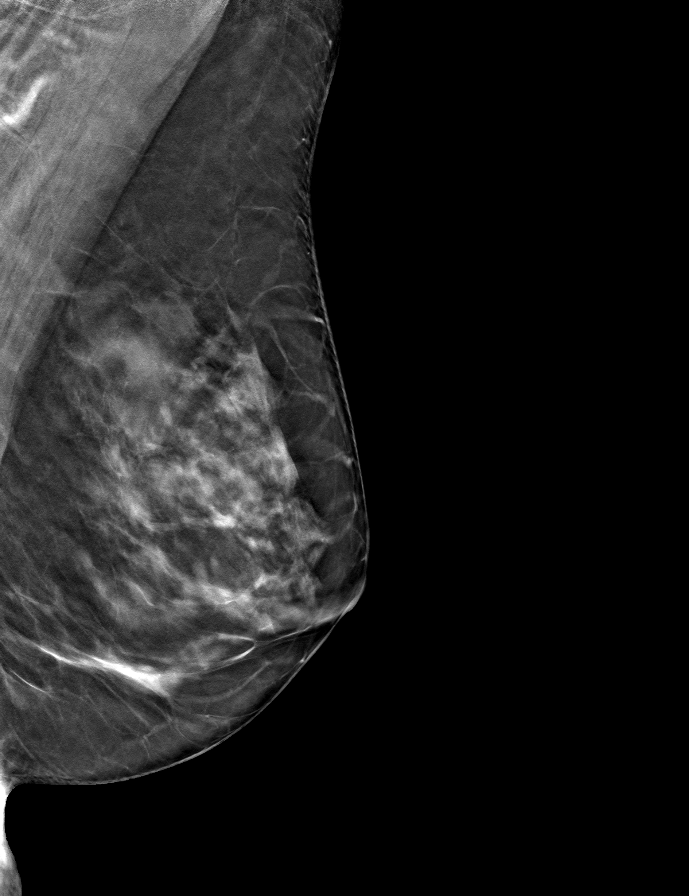

[L CC tomo · tomo slice 37/73.0]
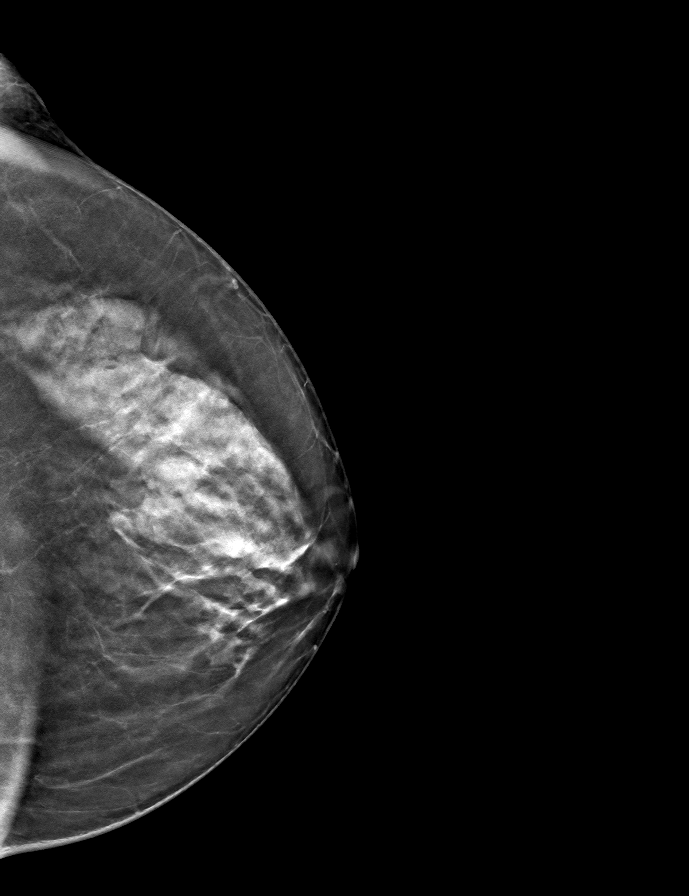

[9 of 24 positions shown; findings below may reference images not displayed]

ACR Breast Density Category c: The breast tissue is heterogeneously
dense, which may obscure small masses.
FINDINGS: There are no findings suspicious for malignancy.
IMPRESSION: No mammographic evidence of malignancy. A result letter of this
screening mammogram will be mailed directly to the patient.

RECOMMENDATION:
Screening mammogram in one year. (Code:Q3-W-BC3)

BI-RADS CATEGORY  1: Negative.

## 2022-07-07 DIAGNOSIS — H01111 Allergic dermatitis of right upper eyelid: Secondary | ICD-10-CM | POA: Diagnosis not present

## 2022-07-07 DIAGNOSIS — H01114 Allergic dermatitis of left upper eyelid: Secondary | ICD-10-CM | POA: Diagnosis not present

## 2022-07-07 DIAGNOSIS — H0100A Unspecified blepharitis right eye, upper and lower eyelids: Secondary | ICD-10-CM | POA: Diagnosis not present

## 2022-07-07 DIAGNOSIS — H01112 Allergic dermatitis of right lower eyelid: Secondary | ICD-10-CM | POA: Diagnosis not present

## 2022-07-07 DIAGNOSIS — H01115 Allergic dermatitis of left lower eyelid: Secondary | ICD-10-CM | POA: Diagnosis not present

## 2022-07-07 DIAGNOSIS — H0100B Unspecified blepharitis left eye, upper and lower eyelids: Secondary | ICD-10-CM | POA: Diagnosis not present

## 2022-08-10 ENCOUNTER — Other Ambulatory Visit: Payer: Self-pay | Admitting: Internal Medicine

## 2022-08-10 ENCOUNTER — Inpatient Hospital Stay: Admission: RE | Admit: 2022-08-10 | Payer: Medicare PPO | Source: Ambulatory Visit

## 2022-08-10 DIAGNOSIS — M810 Age-related osteoporosis without current pathological fracture: Secondary | ICD-10-CM

## 2022-08-15 DIAGNOSIS — M810 Age-related osteoporosis without current pathological fracture: Secondary | ICD-10-CM | POA: Diagnosis not present

## 2022-08-15 DIAGNOSIS — Z79899 Other long term (current) drug therapy: Secondary | ICD-10-CM | POA: Diagnosis not present

## 2022-08-23 DIAGNOSIS — M810 Age-related osteoporosis without current pathological fracture: Secondary | ICD-10-CM | POA: Diagnosis not present

## 2022-09-16 DIAGNOSIS — L821 Other seborrheic keratosis: Secondary | ICD-10-CM | POA: Diagnosis not present

## 2022-09-16 DIAGNOSIS — K219 Gastro-esophageal reflux disease without esophagitis: Secondary | ICD-10-CM | POA: Diagnosis not present

## 2022-09-16 DIAGNOSIS — Z1211 Encounter for screening for malignant neoplasm of colon: Secondary | ICD-10-CM | POA: Diagnosis not present

## 2022-09-16 DIAGNOSIS — L237 Allergic contact dermatitis due to plants, except food: Secondary | ICD-10-CM | POA: Diagnosis not present

## 2022-09-16 DIAGNOSIS — Z79899 Other long term (current) drug therapy: Secondary | ICD-10-CM | POA: Diagnosis not present

## 2022-09-16 DIAGNOSIS — M810 Age-related osteoporosis without current pathological fracture: Secondary | ICD-10-CM | POA: Diagnosis not present

## 2022-09-16 DIAGNOSIS — E7800 Pure hypercholesterolemia, unspecified: Secondary | ICD-10-CM | POA: Diagnosis not present

## 2022-09-16 DIAGNOSIS — L814 Other melanin hyperpigmentation: Secondary | ICD-10-CM | POA: Diagnosis not present

## 2022-09-16 DIAGNOSIS — Z0000 Encounter for general adult medical examination without abnormal findings: Secondary | ICD-10-CM | POA: Diagnosis not present

## 2022-09-16 DIAGNOSIS — Z1239 Encounter for other screening for malignant neoplasm of breast: Secondary | ICD-10-CM | POA: Diagnosis not present

## 2022-09-16 DIAGNOSIS — F325 Major depressive disorder, single episode, in full remission: Secondary | ICD-10-CM | POA: Diagnosis not present

## 2022-09-16 DIAGNOSIS — D2371 Other benign neoplasm of skin of right lower limb, including hip: Secondary | ICD-10-CM | POA: Diagnosis not present

## 2022-09-16 DIAGNOSIS — J45909 Unspecified asthma, uncomplicated: Secondary | ICD-10-CM | POA: Diagnosis not present

## 2022-09-16 DIAGNOSIS — L218 Other seborrheic dermatitis: Secondary | ICD-10-CM | POA: Diagnosis not present

## 2022-09-16 DIAGNOSIS — L720 Epidermal cyst: Secondary | ICD-10-CM | POA: Diagnosis not present

## 2022-09-21 DIAGNOSIS — H6123 Impacted cerumen, bilateral: Secondary | ICD-10-CM | POA: Diagnosis not present

## 2022-11-09 DIAGNOSIS — N3281 Overactive bladder: Secondary | ICD-10-CM | POA: Diagnosis not present

## 2022-11-09 DIAGNOSIS — N3946 Mixed incontinence: Secondary | ICD-10-CM | POA: Diagnosis not present

## 2022-11-09 DIAGNOSIS — N3010 Interstitial cystitis (chronic) without hematuria: Secondary | ICD-10-CM | POA: Diagnosis not present

## 2022-11-16 DIAGNOSIS — K5730 Diverticulosis of large intestine without perforation or abscess without bleeding: Secondary | ICD-10-CM | POA: Diagnosis not present

## 2022-11-16 DIAGNOSIS — K648 Other hemorrhoids: Secondary | ICD-10-CM | POA: Diagnosis not present

## 2022-11-16 DIAGNOSIS — Z1211 Encounter for screening for malignant neoplasm of colon: Secondary | ICD-10-CM | POA: Diagnosis not present

## 2022-11-16 DIAGNOSIS — D123 Benign neoplasm of transverse colon: Secondary | ICD-10-CM | POA: Diagnosis not present

## 2022-11-18 DIAGNOSIS — D123 Benign neoplasm of transverse colon: Secondary | ICD-10-CM | POA: Diagnosis not present

## 2022-11-23 ENCOUNTER — Other Ambulatory Visit: Payer: Self-pay | Admitting: Internal Medicine

## 2022-11-23 DIAGNOSIS — Z1231 Encounter for screening mammogram for malignant neoplasm of breast: Secondary | ICD-10-CM

## 2023-01-14 DIAGNOSIS — Z23 Encounter for immunization: Secondary | ICD-10-CM | POA: Diagnosis not present

## 2023-01-17 DIAGNOSIS — H01114 Allergic dermatitis of left upper eyelid: Secondary | ICD-10-CM | POA: Diagnosis not present

## 2023-01-17 DIAGNOSIS — Z961 Presence of intraocular lens: Secondary | ICD-10-CM | POA: Diagnosis not present

## 2023-01-17 DIAGNOSIS — H01111 Allergic dermatitis of right upper eyelid: Secondary | ICD-10-CM | POA: Diagnosis not present

## 2023-01-30 DIAGNOSIS — Z01419 Encounter for gynecological examination (general) (routine) without abnormal findings: Secondary | ICD-10-CM | POA: Diagnosis not present

## 2023-02-07 DIAGNOSIS — D492 Neoplasm of unspecified behavior of bone, soft tissue, and skin: Secondary | ICD-10-CM | POA: Diagnosis not present

## 2023-02-13 DIAGNOSIS — L538 Other specified erythematous conditions: Secondary | ICD-10-CM | POA: Diagnosis not present

## 2023-02-13 DIAGNOSIS — D485 Neoplasm of uncertain behavior of skin: Secondary | ICD-10-CM | POA: Diagnosis not present

## 2023-02-13 DIAGNOSIS — L986 Other infiltrative disorders of the skin and subcutaneous tissue: Secondary | ICD-10-CM | POA: Diagnosis not present

## 2023-02-13 DIAGNOSIS — L570 Actinic keratosis: Secondary | ICD-10-CM | POA: Diagnosis not present

## 2023-02-13 DIAGNOSIS — L821 Other seborrheic keratosis: Secondary | ICD-10-CM | POA: Diagnosis not present

## 2023-02-13 DIAGNOSIS — B079 Viral wart, unspecified: Secondary | ICD-10-CM | POA: Diagnosis not present

## 2023-02-21 DIAGNOSIS — Z79899 Other long term (current) drug therapy: Secondary | ICD-10-CM | POA: Diagnosis not present

## 2023-02-21 DIAGNOSIS — M810 Age-related osteoporosis without current pathological fracture: Secondary | ICD-10-CM | POA: Diagnosis not present

## 2023-02-24 ENCOUNTER — Ambulatory Visit
Admission: EM | Admit: 2023-02-24 | Discharge: 2023-02-24 | Discharge status: Against Medical Advice | Payer: Medicare PPO

## 2023-02-24 DIAGNOSIS — D75839 Thrombocytosis, unspecified: Secondary | ICD-10-CM | POA: Insufficient documentation

## 2023-02-24 DIAGNOSIS — F325 Major depressive disorder, single episode, in full remission: Secondary | ICD-10-CM | POA: Insufficient documentation

## 2023-02-24 DIAGNOSIS — K219 Gastro-esophageal reflux disease without esophagitis: Secondary | ICD-10-CM | POA: Insufficient documentation

## 2023-02-24 DIAGNOSIS — E7800 Pure hypercholesterolemia, unspecified: Secondary | ICD-10-CM | POA: Insufficient documentation

## 2023-02-24 DIAGNOSIS — J45909 Unspecified asthma, uncomplicated: Secondary | ICD-10-CM | POA: Insufficient documentation

## 2023-02-24 DIAGNOSIS — S0181XD Laceration without foreign body of other part of head, subsequent encounter: Secondary | ICD-10-CM | POA: Diagnosis not present

## 2023-02-24 DIAGNOSIS — M810 Age-related osteoporosis without current pathological fracture: Secondary | ICD-10-CM | POA: Insufficient documentation

## 2023-02-27 DIAGNOSIS — M810 Age-related osteoporosis without current pathological fracture: Secondary | ICD-10-CM | POA: Diagnosis not present

## 2023-03-02 ENCOUNTER — Ambulatory Visit
Admission: RE | Admit: 2023-03-02 | Discharge: 2023-03-02 | Disposition: A | Discharge status: Home / Self Care | Payer: Medicare PPO | Source: Ambulatory Visit | Attending: Internal Medicine | Admitting: Internal Medicine

## 2023-03-02 DIAGNOSIS — Z1231 Encounter for screening mammogram for malignant neoplasm of breast: Secondary | ICD-10-CM

## 2023-03-02 DIAGNOSIS — N958 Other specified menopausal and perimenopausal disorders: Secondary | ICD-10-CM | POA: Diagnosis not present

## 2023-03-02 DIAGNOSIS — M810 Age-related osteoporosis without current pathological fracture: Secondary | ICD-10-CM

## 2023-03-02 DIAGNOSIS — M8588 Other specified disorders of bone density and structure, other site: Secondary | ICD-10-CM | POA: Diagnosis not present

## 2023-03-02 DIAGNOSIS — E2839 Other primary ovarian failure: Secondary | ICD-10-CM | POA: Diagnosis not present

## 2023-09-19 DIAGNOSIS — L720 Epidermal cyst: Secondary | ICD-10-CM | POA: Diagnosis not present

## 2023-09-19 DIAGNOSIS — D2371 Other benign neoplasm of skin of right lower limb, including hip: Secondary | ICD-10-CM | POA: Diagnosis not present

## 2023-09-19 DIAGNOSIS — L821 Other seborrheic keratosis: Secondary | ICD-10-CM | POA: Diagnosis not present

## 2023-09-19 DIAGNOSIS — L814 Other melanin hyperpigmentation: Secondary | ICD-10-CM | POA: Diagnosis not present

## 2023-09-20 DIAGNOSIS — E7800 Pure hypercholesterolemia, unspecified: Secondary | ICD-10-CM | POA: Diagnosis not present

## 2023-09-20 DIAGNOSIS — Z79899 Other long term (current) drug therapy: Secondary | ICD-10-CM | POA: Diagnosis not present

## 2023-09-20 DIAGNOSIS — J45909 Unspecified asthma, uncomplicated: Secondary | ICD-10-CM | POA: Diagnosis not present

## 2023-09-20 DIAGNOSIS — Z0000 Encounter for general adult medical examination without abnormal findings: Secondary | ICD-10-CM | POA: Diagnosis not present

## 2023-09-20 DIAGNOSIS — M810 Age-related osteoporosis without current pathological fracture: Secondary | ICD-10-CM | POA: Diagnosis not present

## 2023-09-20 DIAGNOSIS — Z634 Disappearance and death of family member: Secondary | ICD-10-CM | POA: Diagnosis not present

## 2023-09-20 DIAGNOSIS — F325 Major depressive disorder, single episode, in full remission: Secondary | ICD-10-CM | POA: Diagnosis not present

## 2023-09-20 DIAGNOSIS — K219 Gastro-esophageal reflux disease without esophagitis: Secondary | ICD-10-CM | POA: Diagnosis not present

## 2023-09-20 DIAGNOSIS — D473 Essential (hemorrhagic) thrombocythemia: Secondary | ICD-10-CM | POA: Diagnosis not present

## 2023-11-10 DIAGNOSIS — N3946 Mixed incontinence: Secondary | ICD-10-CM | POA: Diagnosis not present

## 2023-11-10 DIAGNOSIS — N3010 Interstitial cystitis (chronic) without hematuria: Secondary | ICD-10-CM | POA: Diagnosis not present

## 2024-01-24 DIAGNOSIS — Z961 Presence of intraocular lens: Secondary | ICD-10-CM | POA: Diagnosis not present

## 2024-01-24 DIAGNOSIS — H3500 Unspecified background retinopathy: Secondary | ICD-10-CM | POA: Diagnosis not present
